# Patient Record
Sex: Female | Born: 1985 | Race: Black or African American | Hispanic: No | Marital: Married | Smoking: Never smoker
Health system: Southern US, Community
[De-identification: ages and names within clinical notes are randomized; demographics above are authoritative.]

## PROBLEM LIST (undated history)

## (undated) DIAGNOSIS — E119 Type 2 diabetes mellitus without complications: Secondary | ICD-10-CM

---

## 2011-06-10 ENCOUNTER — Emergency Department (HOSPITAL_COMMUNITY)
Admission: EM | Admit: 2011-06-10 | Discharge: 2011-06-11 | Disposition: A | Discharge status: Home / Self Care | Payer: Worker's Compensation | Attending: Emergency Medicine | Admitting: Emergency Medicine

## 2011-06-10 DIAGNOSIS — S060X0A Concussion without loss of consciousness, initial encounter: Secondary | ICD-10-CM | POA: Insufficient documentation

## 2011-06-10 DIAGNOSIS — X58XXXA Exposure to other specified factors, initial encounter: Secondary | ICD-10-CM | POA: Insufficient documentation

## 2016-08-31 DIAGNOSIS — H6692 Otitis media, unspecified, left ear: Secondary | ICD-10-CM | POA: Diagnosis not present

## 2016-09-29 DIAGNOSIS — M9903 Segmental and somatic dysfunction of lumbar region: Secondary | ICD-10-CM | POA: Diagnosis not present

## 2016-09-29 DIAGNOSIS — M9901 Segmental and somatic dysfunction of cervical region: Secondary | ICD-10-CM | POA: Diagnosis not present

## 2016-09-29 DIAGNOSIS — M4126 Other idiopathic scoliosis, lumbar region: Secondary | ICD-10-CM | POA: Diagnosis not present

## 2016-09-29 DIAGNOSIS — M9902 Segmental and somatic dysfunction of thoracic region: Secondary | ICD-10-CM | POA: Diagnosis not present

## 2016-09-30 DIAGNOSIS — M4126 Other idiopathic scoliosis, lumbar region: Secondary | ICD-10-CM | POA: Diagnosis not present

## 2016-09-30 DIAGNOSIS — M9901 Segmental and somatic dysfunction of cervical region: Secondary | ICD-10-CM | POA: Diagnosis not present

## 2016-09-30 DIAGNOSIS — M9903 Segmental and somatic dysfunction of lumbar region: Secondary | ICD-10-CM | POA: Diagnosis not present

## 2016-09-30 DIAGNOSIS — M9902 Segmental and somatic dysfunction of thoracic region: Secondary | ICD-10-CM | POA: Diagnosis not present

## 2016-10-03 DIAGNOSIS — M4126 Other idiopathic scoliosis, lumbar region: Secondary | ICD-10-CM | POA: Diagnosis not present

## 2016-10-03 DIAGNOSIS — M9902 Segmental and somatic dysfunction of thoracic region: Secondary | ICD-10-CM | POA: Diagnosis not present

## 2016-10-03 DIAGNOSIS — M9901 Segmental and somatic dysfunction of cervical region: Secondary | ICD-10-CM | POA: Diagnosis not present

## 2016-10-03 DIAGNOSIS — M9903 Segmental and somatic dysfunction of lumbar region: Secondary | ICD-10-CM | POA: Diagnosis not present

## 2016-10-07 DIAGNOSIS — M9901 Segmental and somatic dysfunction of cervical region: Secondary | ICD-10-CM | POA: Diagnosis not present

## 2016-10-07 DIAGNOSIS — M9902 Segmental and somatic dysfunction of thoracic region: Secondary | ICD-10-CM | POA: Diagnosis not present

## 2016-10-07 DIAGNOSIS — M4126 Other idiopathic scoliosis, lumbar region: Secondary | ICD-10-CM | POA: Diagnosis not present

## 2016-10-07 DIAGNOSIS — M9903 Segmental and somatic dysfunction of lumbar region: Secondary | ICD-10-CM | POA: Diagnosis not present

## 2016-10-10 DIAGNOSIS — M9901 Segmental and somatic dysfunction of cervical region: Secondary | ICD-10-CM | POA: Diagnosis not present

## 2016-10-10 DIAGNOSIS — M9902 Segmental and somatic dysfunction of thoracic region: Secondary | ICD-10-CM | POA: Diagnosis not present

## 2016-10-10 DIAGNOSIS — M4126 Other idiopathic scoliosis, lumbar region: Secondary | ICD-10-CM | POA: Diagnosis not present

## 2016-10-10 DIAGNOSIS — M9903 Segmental and somatic dysfunction of lumbar region: Secondary | ICD-10-CM | POA: Diagnosis not present

## 2016-10-14 DIAGNOSIS — M9903 Segmental and somatic dysfunction of lumbar region: Secondary | ICD-10-CM | POA: Diagnosis not present

## 2016-10-14 DIAGNOSIS — M9902 Segmental and somatic dysfunction of thoracic region: Secondary | ICD-10-CM | POA: Diagnosis not present

## 2016-10-14 DIAGNOSIS — M4126 Other idiopathic scoliosis, lumbar region: Secondary | ICD-10-CM | POA: Diagnosis not present

## 2016-10-14 DIAGNOSIS — M9901 Segmental and somatic dysfunction of cervical region: Secondary | ICD-10-CM | POA: Diagnosis not present

## 2016-10-17 DIAGNOSIS — M9901 Segmental and somatic dysfunction of cervical region: Secondary | ICD-10-CM | POA: Diagnosis not present

## 2016-10-17 DIAGNOSIS — M4126 Other idiopathic scoliosis, lumbar region: Secondary | ICD-10-CM | POA: Diagnosis not present

## 2016-10-17 DIAGNOSIS — M9902 Segmental and somatic dysfunction of thoracic region: Secondary | ICD-10-CM | POA: Diagnosis not present

## 2016-10-17 DIAGNOSIS — M9903 Segmental and somatic dysfunction of lumbar region: Secondary | ICD-10-CM | POA: Diagnosis not present

## 2016-10-24 DIAGNOSIS — M9902 Segmental and somatic dysfunction of thoracic region: Secondary | ICD-10-CM | POA: Diagnosis not present

## 2016-10-24 DIAGNOSIS — M9903 Segmental and somatic dysfunction of lumbar region: Secondary | ICD-10-CM | POA: Diagnosis not present

## 2016-10-24 DIAGNOSIS — M9901 Segmental and somatic dysfunction of cervical region: Secondary | ICD-10-CM | POA: Diagnosis not present

## 2016-10-24 DIAGNOSIS — M4126 Other idiopathic scoliosis, lumbar region: Secondary | ICD-10-CM | POA: Diagnosis not present

## 2016-10-31 DIAGNOSIS — M4126 Other idiopathic scoliosis, lumbar region: Secondary | ICD-10-CM | POA: Diagnosis not present

## 2016-10-31 DIAGNOSIS — M9901 Segmental and somatic dysfunction of cervical region: Secondary | ICD-10-CM | POA: Diagnosis not present

## 2016-10-31 DIAGNOSIS — M9902 Segmental and somatic dysfunction of thoracic region: Secondary | ICD-10-CM | POA: Diagnosis not present

## 2016-10-31 DIAGNOSIS — M9903 Segmental and somatic dysfunction of lumbar region: Secondary | ICD-10-CM | POA: Diagnosis not present

## 2016-11-07 DIAGNOSIS — M9901 Segmental and somatic dysfunction of cervical region: Secondary | ICD-10-CM | POA: Diagnosis not present

## 2016-11-07 DIAGNOSIS — M9903 Segmental and somatic dysfunction of lumbar region: Secondary | ICD-10-CM | POA: Diagnosis not present

## 2016-11-07 DIAGNOSIS — M4126 Other idiopathic scoliosis, lumbar region: Secondary | ICD-10-CM | POA: Diagnosis not present

## 2016-11-07 DIAGNOSIS — M9902 Segmental and somatic dysfunction of thoracic region: Secondary | ICD-10-CM | POA: Diagnosis not present

## 2016-11-14 DIAGNOSIS — M9901 Segmental and somatic dysfunction of cervical region: Secondary | ICD-10-CM | POA: Diagnosis not present

## 2016-11-14 DIAGNOSIS — M4126 Other idiopathic scoliosis, lumbar region: Secondary | ICD-10-CM | POA: Diagnosis not present

## 2016-11-14 DIAGNOSIS — M9902 Segmental and somatic dysfunction of thoracic region: Secondary | ICD-10-CM | POA: Diagnosis not present

## 2016-11-14 DIAGNOSIS — M9903 Segmental and somatic dysfunction of lumbar region: Secondary | ICD-10-CM | POA: Diagnosis not present

## 2016-12-05 DIAGNOSIS — Z113 Encounter for screening for infections with a predominantly sexual mode of transmission: Secondary | ICD-10-CM | POA: Diagnosis not present

## 2016-12-05 DIAGNOSIS — L0292 Furuncle, unspecified: Secondary | ICD-10-CM | POA: Diagnosis not present

## 2016-12-05 DIAGNOSIS — Z0000 Encounter for general adult medical examination without abnormal findings: Secondary | ICD-10-CM | POA: Diagnosis not present

## 2016-12-05 DIAGNOSIS — E669 Obesity, unspecified: Secondary | ICD-10-CM | POA: Diagnosis not present

## 2016-12-05 DIAGNOSIS — Z01419 Encounter for gynecological examination (general) (routine) without abnormal findings: Secondary | ICD-10-CM | POA: Diagnosis not present

## 2016-12-05 DIAGNOSIS — Z01411 Encounter for gynecological examination (general) (routine) with abnormal findings: Secondary | ICD-10-CM | POA: Diagnosis not present

## 2016-12-12 DIAGNOSIS — M4126 Other idiopathic scoliosis, lumbar region: Secondary | ICD-10-CM | POA: Diagnosis not present

## 2016-12-12 DIAGNOSIS — M9902 Segmental and somatic dysfunction of thoracic region: Secondary | ICD-10-CM | POA: Diagnosis not present

## 2016-12-12 DIAGNOSIS — M9903 Segmental and somatic dysfunction of lumbar region: Secondary | ICD-10-CM | POA: Diagnosis not present

## 2016-12-12 DIAGNOSIS — M9901 Segmental and somatic dysfunction of cervical region: Secondary | ICD-10-CM | POA: Diagnosis not present

## 2017-01-09 DIAGNOSIS — M9901 Segmental and somatic dysfunction of cervical region: Secondary | ICD-10-CM | POA: Diagnosis not present

## 2017-01-09 DIAGNOSIS — M9903 Segmental and somatic dysfunction of lumbar region: Secondary | ICD-10-CM | POA: Diagnosis not present

## 2017-01-09 DIAGNOSIS — M4126 Other idiopathic scoliosis, lumbar region: Secondary | ICD-10-CM | POA: Diagnosis not present

## 2017-01-09 DIAGNOSIS — M9902 Segmental and somatic dysfunction of thoracic region: Secondary | ICD-10-CM | POA: Diagnosis not present

## 2017-03-06 DIAGNOSIS — M9901 Segmental and somatic dysfunction of cervical region: Secondary | ICD-10-CM | POA: Diagnosis not present

## 2017-03-06 DIAGNOSIS — M9902 Segmental and somatic dysfunction of thoracic region: Secondary | ICD-10-CM | POA: Diagnosis not present

## 2017-03-06 DIAGNOSIS — M9903 Segmental and somatic dysfunction of lumbar region: Secondary | ICD-10-CM | POA: Diagnosis not present

## 2017-03-06 DIAGNOSIS — M4126 Other idiopathic scoliosis, lumbar region: Secondary | ICD-10-CM | POA: Diagnosis not present

## 2017-05-01 DIAGNOSIS — M9902 Segmental and somatic dysfunction of thoracic region: Secondary | ICD-10-CM | POA: Diagnosis not present

## 2017-05-01 DIAGNOSIS — M4126 Other idiopathic scoliosis, lumbar region: Secondary | ICD-10-CM | POA: Diagnosis not present

## 2017-05-01 DIAGNOSIS — M9901 Segmental and somatic dysfunction of cervical region: Secondary | ICD-10-CM | POA: Diagnosis not present

## 2017-05-01 DIAGNOSIS — M9903 Segmental and somatic dysfunction of lumbar region: Secondary | ICD-10-CM | POA: Diagnosis not present

## 2017-06-26 DIAGNOSIS — M9902 Segmental and somatic dysfunction of thoracic region: Secondary | ICD-10-CM | POA: Diagnosis not present

## 2017-06-26 DIAGNOSIS — M4126 Other idiopathic scoliosis, lumbar region: Secondary | ICD-10-CM | POA: Diagnosis not present

## 2017-06-26 DIAGNOSIS — M9903 Segmental and somatic dysfunction of lumbar region: Secondary | ICD-10-CM | POA: Diagnosis not present

## 2017-06-26 DIAGNOSIS — M9901 Segmental and somatic dysfunction of cervical region: Secondary | ICD-10-CM | POA: Diagnosis not present

## 2017-08-23 DIAGNOSIS — M4126 Other idiopathic scoliosis, lumbar region: Secondary | ICD-10-CM | POA: Diagnosis not present

## 2017-08-23 DIAGNOSIS — M9903 Segmental and somatic dysfunction of lumbar region: Secondary | ICD-10-CM | POA: Diagnosis not present

## 2017-08-23 DIAGNOSIS — M9901 Segmental and somatic dysfunction of cervical region: Secondary | ICD-10-CM | POA: Diagnosis not present

## 2017-08-23 DIAGNOSIS — M9902 Segmental and somatic dysfunction of thoracic region: Secondary | ICD-10-CM | POA: Diagnosis not present

## 2017-10-16 DIAGNOSIS — M9903 Segmental and somatic dysfunction of lumbar region: Secondary | ICD-10-CM | POA: Diagnosis not present

## 2017-10-16 DIAGNOSIS — M9902 Segmental and somatic dysfunction of thoracic region: Secondary | ICD-10-CM | POA: Diagnosis not present

## 2017-10-16 DIAGNOSIS — M9901 Segmental and somatic dysfunction of cervical region: Secondary | ICD-10-CM | POA: Diagnosis not present

## 2017-10-16 DIAGNOSIS — M4126 Other idiopathic scoliosis, lumbar region: Secondary | ICD-10-CM | POA: Diagnosis not present

## 2017-12-11 DIAGNOSIS — M9902 Segmental and somatic dysfunction of thoracic region: Secondary | ICD-10-CM | POA: Diagnosis not present

## 2017-12-11 DIAGNOSIS — M9903 Segmental and somatic dysfunction of lumbar region: Secondary | ICD-10-CM | POA: Diagnosis not present

## 2017-12-11 DIAGNOSIS — M9901 Segmental and somatic dysfunction of cervical region: Secondary | ICD-10-CM | POA: Diagnosis not present

## 2017-12-11 DIAGNOSIS — M4126 Other idiopathic scoliosis, lumbar region: Secondary | ICD-10-CM | POA: Diagnosis not present

## 2017-12-26 DIAGNOSIS — J019 Acute sinusitis, unspecified: Secondary | ICD-10-CM | POA: Diagnosis not present

## 2017-12-26 DIAGNOSIS — H6693 Otitis media, unspecified, bilateral: Secondary | ICD-10-CM | POA: Diagnosis not present

## 2018-02-12 DIAGNOSIS — M9903 Segmental and somatic dysfunction of lumbar region: Secondary | ICD-10-CM | POA: Diagnosis not present

## 2018-02-12 DIAGNOSIS — M9902 Segmental and somatic dysfunction of thoracic region: Secondary | ICD-10-CM | POA: Diagnosis not present

## 2018-02-12 DIAGNOSIS — M9901 Segmental and somatic dysfunction of cervical region: Secondary | ICD-10-CM | POA: Diagnosis not present

## 2018-02-12 DIAGNOSIS — M4126 Other idiopathic scoliosis, lumbar region: Secondary | ICD-10-CM | POA: Diagnosis not present

## 2018-03-02 ENCOUNTER — Emergency Department (HOSPITAL_BASED_OUTPATIENT_CLINIC_OR_DEPARTMENT_OTHER)
Admission: EM | Admit: 2018-03-02 | Discharge: 2018-03-02 | Disposition: A | Discharge status: Home / Self Care | Payer: BLUE CROSS/BLUE SHIELD | Attending: Emergency Medicine | Admitting: Emergency Medicine

## 2018-03-02 ENCOUNTER — Encounter (HOSPITAL_BASED_OUTPATIENT_CLINIC_OR_DEPARTMENT_OTHER): Payer: Self-pay | Admitting: Emergency Medicine

## 2018-03-02 ENCOUNTER — Other Ambulatory Visit: Payer: Self-pay

## 2018-03-02 DIAGNOSIS — D72829 Elevated white blood cell count, unspecified: Secondary | ICD-10-CM | POA: Diagnosis not present

## 2018-03-02 DIAGNOSIS — B9689 Other specified bacterial agents as the cause of diseases classified elsewhere: Secondary | ICD-10-CM | POA: Diagnosis not present

## 2018-03-02 DIAGNOSIS — R102 Pelvic and perineal pain: Secondary | ICD-10-CM

## 2018-03-02 DIAGNOSIS — E119 Type 2 diabetes mellitus without complications: Secondary | ICD-10-CM | POA: Insufficient documentation

## 2018-03-02 DIAGNOSIS — N760 Acute vaginitis: Secondary | ICD-10-CM | POA: Insufficient documentation

## 2018-03-02 HISTORY — DX: Type 2 diabetes mellitus without complications: E11.9

## 2018-03-02 LAB — URINALYSIS, MICROSCOPIC (REFLEX)

## 2018-03-02 LAB — BASIC METABOLIC PANEL
ANION GAP: 6 (ref 5–15)
BUN: 11 mg/dL (ref 6–20)
CALCIUM: 9 mg/dL (ref 8.9–10.3)
CO2: 25 mmol/L (ref 22–32)
Chloride: 105 mmol/L (ref 101–111)
Creatinine, Ser: 0.67 mg/dL (ref 0.44–1.00)
GFR calc non Af Amer: >60 mL/min (ref >60)
GLUCOSE: 124 mg/dL — AB (ref 65–99)
POTASSIUM: 4.1 mmol/L (ref 3.5–5.1)
Sodium: 136 mmol/L (ref 135–145)

## 2018-03-02 LAB — CBC
HEMATOCRIT: 36.9 % (ref 36.0–46.0)
Hemoglobin: 12.7 g/dL (ref 12.0–15.0)
MCH: 30.2 pg (ref 26.0–34.0)
MCHC: 34.4 g/dL (ref 30.0–36.0)
MCV: 87.9 fL (ref 78.0–100.0)
Platelets: 296 10*3/uL (ref 150–400)
RBC: 4.2 MIL/uL (ref 3.87–5.11)
RDW: 12.6 % (ref 11.5–15.5)
WBC: 12 10*3/uL — AB (ref 4.0–10.5)

## 2018-03-02 LAB — PREGNANCY, URINE: PREG TEST UR: NEGATIVE

## 2018-03-02 LAB — URINALYSIS, ROUTINE W REFLEX MICROSCOPIC
BILIRUBIN URINE: NEGATIVE
Glucose, UA: NEGATIVE mg/dL
Ketones, ur: NEGATIVE mg/dL
LEUKOCYTES UA: NEGATIVE
NITRITE: NEGATIVE
PH: 6 (ref 5.0–8.0)
Protein, ur: NEGATIVE mg/dL

## 2018-03-02 LAB — WET PREP, GENITAL
Sperm: NONE SEEN
TRICH WET PREP: NONE SEEN
Yeast Wet Prep HPF POC: NONE SEEN

## 2018-03-02 MED ORDER — METRONIDAZOLE 500 MG PO TABS: 500.0000 mg | ORAL_TABLET | Freq: Two times a day (BID) | ORAL | 0 refills | Status: AC

## 2018-03-02 NOTE — Discharge Instructions (Addendum)
Take Flagyl as prescribed.  Take the entire course, even if your symptoms improve.  Do not drink alcohol while taking this medicine, as it can cause liver damage. Continue taking your vitamin D and other supplementation as prescribed. It is important that you follow-up with OB/GYN for further evaluation and management of your symptoms. Take Tylenol or ibuprofen as needed for pain.  You may try heating pads. Return to the emergency room if you develop high fevers, vomiting, severe abdominal pain, or any new or concerning symptoms.

## 2018-03-02 NOTE — ED Provider Notes (Signed)
[facility] EMERGENCY DEPARTMENT Provider Note   CSN: [id number] Arrival date & time: 03/02/18  1450     History   Chief Complaint Chief Complaint  Patient presents with  . Pelvic Pain    HPI Kimberly Sanchez is a 32 y.o. female presenting for evaluation of pelvic pain.  Patient states that she started her period last night.  Today, her period became heavier.  She started to have suprapubic pain which became very severe.  She passed a clot, and the pain lessened.  She reports having issues with her kidneys due to low vitamin D levels, for which she is taking supplementation.  She denies fevers, chills, chest pain, shortness breath, nausea, vomiting, upper abdominal pain, urinary symptoms, abnormal bowel movements.  She denies vaginal discharge.  She is sexually active with one female partner, they use condoms every time.  She is not concerned about STDs at this time.  She denies other medical problems, is diagnosed with prediabetes but does not take medication for this.  Patient's OB/GYN is considering a diagnosis of PCOS, patient has very irregular periods.  Last menstrual cycle was in February, patient states this is normal for her.  Currently, patient reports her pain is dull and not as severe as earlier today.  HPI  Past Medical History:  Diagnosis Date  . Diabetes mellitus without complication (HCC)     There are no active problems to display for this patient.   History reviewed. No pertinent surgical history.   OB History   None      Home Medications    Prior to Admission medications   Medication Sig Start Date End Date Taking? Authorizing Provider  Multiple Vitamin (MULTIVITAMIN WITH MINERALS) TABS tablet Take 1 tablet by mouth daily.   Yes [provider]  metroNIDAZOLE (FLAGYL) 500 MG tablet Take 1 tablet (500 mg total) by mouth 2 (two) times daily. 03/02/18   Prynce Jacober, PA-C    Family History No family history on file.  Social  History Social History   Tobacco Use  . Smoking status: Never Smoker  . Smokeless tobacco: Never Used  Substance Use Topics  . Alcohol use: Yes  . Drug use: Not on file     Allergies   Patient has no known allergies.   Review of Systems Review of Systems  Genitourinary: Positive for pelvic pain and vaginal bleeding.  All other systems reviewed and are negative.    Physical Exam Updated Vital Signs BP 114/81 (BP Location: Left Arm)   Pulse 66   Temp 98.3 F (36.8 C) (Oral)   Resp 18   Ht 5\' 8"  (1.727 m)   Wt 108.9 kg (240 lb)   SpO2 100%   BMI 36.49 kg/m   Physical Exam  Constitutional: She is oriented to person, place, and time. She appears well-developed and well-nourished. No distress.  Appears in no distress  HENT:  Head: Normocephalic and atraumatic.  Eyes: Pupils are equal, round, and reactive to light. Conjunctivae and EOM are normal.  Neck: Normal range of motion. Neck supple.  Cardiovascular: Normal rate, regular rhythm and intact distal pulses.  Pulmonary/Chest: Effort normal and breath sounds normal. No respiratory distress. She has no wheezes.  Abdominal: Soft. Bowel sounds are normal. She exhibits no distension and no mass. There is no tenderness. There is no rebound and no guarding.  No tenderness to palpation of the abdomen.  Soft without rigidity, guarding, distention.  Genitourinary: Rectum normal, vagina normal and uterus normal. Pelvic  exam was performed with patient supine. There is no rash, tenderness or lesion on the right labia. There is no rash, tenderness or lesion on the left labia. Cervix exhibits no motion tenderness and no friability. Right adnexum displays no tenderness. Left adnexum displays no tenderness.  Genitourinary Comments: Chaperone present.  Bleeding and clear mucus noted coming from the cervix.  No CMT or adnexal tenderness.  Musculoskeletal: Normal range of motion.  Neurological: She is alert and oriented to person, place, and  time.  Skin: Skin is warm and dry.  Psychiatric: She has a normal mood and affect.  Nursing note and vitals reviewed.    ED Treatments / Results  Labs (all labs ordered are listed, but only abnormal results are displayed) Labs Reviewed  WET PREP, GENITAL - Abnormal; Notable for the following components:      Result Value   Clue Cells Wet Prep HPF POC PRESENT (*)    WBC, Wet Prep HPF POC FEW (*)    All other components within normal limits  URINALYSIS, ROUTINE W REFLEX MICROSCOPIC - Abnormal; Notable for the following components:   APPearance CLOUDY (*)    Specific Gravity, Urine >1.030 (*)    Hgb urine dipstick LARGE (*)    All other components within normal limits  CBC - Abnormal; Notable for the following components:   WBC 12.0 (*)    All other components within normal limits  BASIC METABOLIC PANEL - Abnormal; Notable for the following components:   Glucose, Bld 124 (*)    All other components within normal limits  URINALYSIS, MICROSCOPIC (REFLEX) - Abnormal; Notable for the following components:   Bacteria, UA RARE (*)    All other components within normal limits  PREGNANCY, URINE  RPR  HIV ANTIBODY (ROUTINE TESTING)  GC/CHLAMYDIA PROBE AMP ([place] AT [facility]    EKG None  Radiology No results found.  Procedures Procedures (including critical care time)  Medications Ordered in ED Medications - No data to display   Initial Impression / Assessment and Plan / ED Course  I have reviewed the triage vital signs and the nursing notes.  Pertinent labs & imaging results that were available during my care of the patient were reviewed by me and considered in my medical decision making (see chart for details).     Pt presenting for evaluation of pelvic pain.  Physical exam reassuring, pain has improved.  Pelvic exam without CMT or adnexal tenderness.  Patient reports pain is now dull.  She is afebrile not tachycardic.  Appears nontoxic.  Will obtain basic labs,  wet prep, GC/CL, and urine.  Labs reassuring, creatinine stable.  Mild leukocytosis.  Clue cells present, will treat for BV.  UA without infection, pregnancy negative.  As pelvic exam was reassuring, doubt TOA, PID, ectopic.  I do not believe pelvic ultrasound is necessary at this time.  Discussed importance of follow-up with OB/GYN for further evaluation and management.  Patient states she understands and will follow-up in the next 1 to 2 weeks.  At this time, patient appears safe for discharge.  Return precautions given.  Patient states she understands and agrees to plan.  Final Clinical Impressions(s) / ED Diagnoses   Final diagnoses:  BV (bacterial vaginosis)  Pelvic pain in female    ED Discharge Orders        Ordered    metroNIDAZOLE (FLAGYL) 500 MG tablet  2 times daily     03/02/18 1658       Sheyann Sulton,  Jeanette Caprice, PA-C 03/02/18 1700    Charlynne Pander, MD 03/02/18 2322

## 2018-03-02 NOTE — ED Triage Notes (Signed)
Pelvic pain with spotting since early this morning. Denies vaginal discharge.

## 2018-03-02 NOTE — ED Notes (Signed)
ED Provider at bedside. 

## 2018-03-03 LAB — HIV ANTIBODY (ROUTINE TESTING W REFLEX): HIV SCREEN 4TH GENERATION: NONREACTIVE

## 2018-03-03 LAB — SYPHILIS: RPR W/REFLEX TO RPR TITER AND TREPONEMAL ANTIBODIES, TRADITIONAL SCREENING AND DIAGNOSIS ALGORITHM: RPR Ser Ql: NONREACTIVE

## 2018-03-05 LAB — GC/CHLAMYDIA PROBE AMP (~~LOC~~) NOT AT ARMC
Chlamydia: NEGATIVE
NEISSERIA GONORRHEA: NEGATIVE

## 2018-04-09 DIAGNOSIS — M4126 Other idiopathic scoliosis, lumbar region: Secondary | ICD-10-CM | POA: Diagnosis not present

## 2018-04-09 DIAGNOSIS — M9903 Segmental and somatic dysfunction of lumbar region: Secondary | ICD-10-CM | POA: Diagnosis not present

## 2018-04-09 DIAGNOSIS — M9901 Segmental and somatic dysfunction of cervical region: Secondary | ICD-10-CM | POA: Diagnosis not present

## 2018-04-09 DIAGNOSIS — M9902 Segmental and somatic dysfunction of thoracic region: Secondary | ICD-10-CM | POA: Diagnosis not present

## 2018-05-23 DIAGNOSIS — M4126 Other idiopathic scoliosis, lumbar region: Secondary | ICD-10-CM | POA: Diagnosis not present

## 2018-05-23 DIAGNOSIS — M9901 Segmental and somatic dysfunction of cervical region: Secondary | ICD-10-CM | POA: Diagnosis not present

## 2018-05-23 DIAGNOSIS — M9902 Segmental and somatic dysfunction of thoracic region: Secondary | ICD-10-CM | POA: Diagnosis not present

## 2018-05-23 DIAGNOSIS — M9903 Segmental and somatic dysfunction of lumbar region: Secondary | ICD-10-CM | POA: Diagnosis not present

## 2018-06-11 DIAGNOSIS — M4126 Other idiopathic scoliosis, lumbar region: Secondary | ICD-10-CM | POA: Diagnosis not present

## 2018-06-11 DIAGNOSIS — M9902 Segmental and somatic dysfunction of thoracic region: Secondary | ICD-10-CM | POA: Diagnosis not present

## 2018-06-11 DIAGNOSIS — M9903 Segmental and somatic dysfunction of lumbar region: Secondary | ICD-10-CM | POA: Diagnosis not present

## 2018-06-11 DIAGNOSIS — M9901 Segmental and somatic dysfunction of cervical region: Secondary | ICD-10-CM | POA: Diagnosis not present

## 2018-07-13 DIAGNOSIS — R21 Rash and other nonspecific skin eruption: Secondary | ICD-10-CM | POA: Diagnosis not present

## 2018-07-30 DIAGNOSIS — M9903 Segmental and somatic dysfunction of lumbar region: Secondary | ICD-10-CM | POA: Diagnosis not present

## 2018-07-30 DIAGNOSIS — M9902 Segmental and somatic dysfunction of thoracic region: Secondary | ICD-10-CM | POA: Diagnosis not present

## 2018-07-30 DIAGNOSIS — M4126 Other idiopathic scoliosis, lumbar region: Secondary | ICD-10-CM | POA: Diagnosis not present

## 2018-07-30 DIAGNOSIS — M9901 Segmental and somatic dysfunction of cervical region: Secondary | ICD-10-CM | POA: Diagnosis not present

## 2018-09-11 DIAGNOSIS — M9901 Segmental and somatic dysfunction of cervical region: Secondary | ICD-10-CM | POA: Diagnosis not present

## 2018-09-11 DIAGNOSIS — M9903 Segmental and somatic dysfunction of lumbar region: Secondary | ICD-10-CM | POA: Diagnosis not present

## 2018-09-11 DIAGNOSIS — M4126 Other idiopathic scoliosis, lumbar region: Secondary | ICD-10-CM | POA: Diagnosis not present

## 2018-09-11 DIAGNOSIS — M9902 Segmental and somatic dysfunction of thoracic region: Secondary | ICD-10-CM | POA: Diagnosis not present

## 2020-09-19 ENCOUNTER — Other Ambulatory Visit: Payer: BLUE CROSS/BLUE SHIELD

## 2020-09-19 ENCOUNTER — Other Ambulatory Visit: Payer: Self-pay

## 2020-09-19 DIAGNOSIS — Z20822 Contact with and (suspected) exposure to covid-19: Secondary | ICD-10-CM

## 2020-09-22 LAB — NOVEL CORONAVIRUS, NAA: SARS-CoV-2, NAA: NOT DETECTED

## 2020-09-22 LAB — SARS-COV-2, NAA 2 DAY TAT

## 2022-02-09 ENCOUNTER — Other Ambulatory Visit: Payer: Self-pay | Admitting: Physician Assistant

## 2022-02-09 DIAGNOSIS — N921 Excessive and frequent menstruation with irregular cycle: Secondary | ICD-10-CM

## 2022-02-10 ENCOUNTER — Other Ambulatory Visit: Payer: Managed Care, Other (non HMO)

## 2022-02-11 ENCOUNTER — Ambulatory Visit
Admission: RE | Admit: 2022-02-11 | Discharge: 2022-02-11 | Disposition: A | Discharge status: Home / Self Care | Payer: Managed Care, Other (non HMO) | Source: Ambulatory Visit | Attending: Physician Assistant | Admitting: Physician Assistant

## 2022-02-11 DIAGNOSIS — N921 Excessive and frequent menstruation with irregular cycle: Secondary | ICD-10-CM

## 2022-06-24 IMAGING — US US PELVIS COMPLETE WITH TRANSVAGINAL
1 series · 13 of 25 positions shown · non-contrast
Comparison: None Available.

CLINICAL DATA: Initial evaluation for menorrhagia with irregular
cycle



[Series 1: us pelvis complete with transvaginal · 0.36mm/px · 13 of 44 slices shown]
[im 1/44]
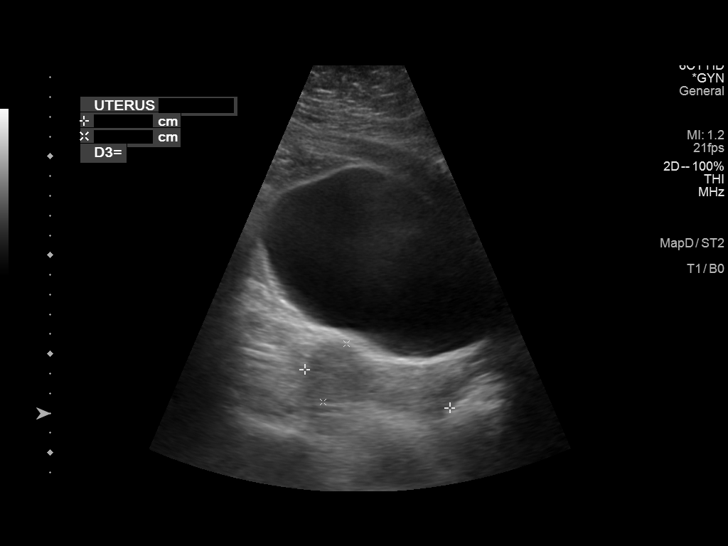
[im 4/44]
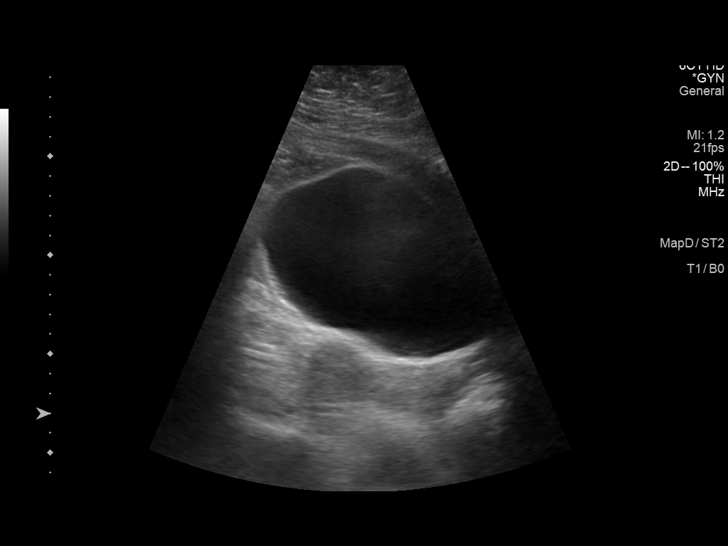
[im 8/44]
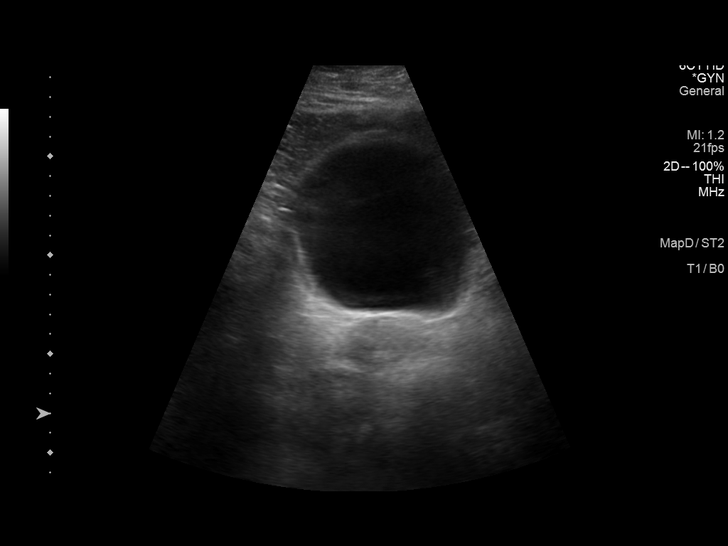
[im 11/44]
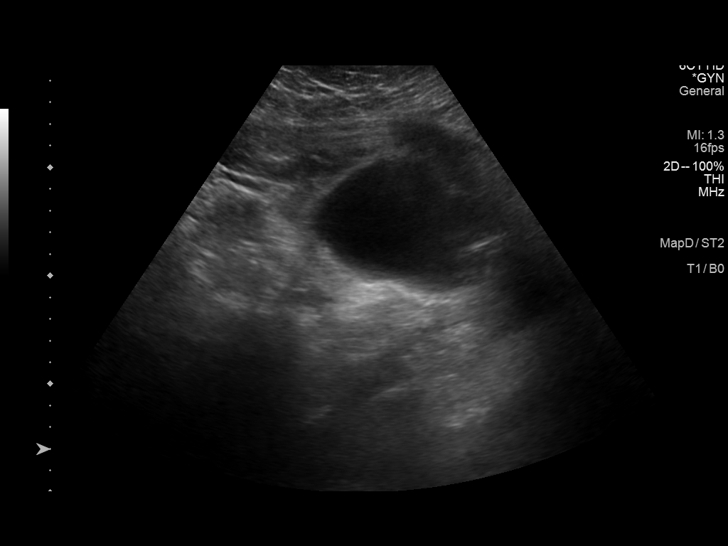
[im 15/44]
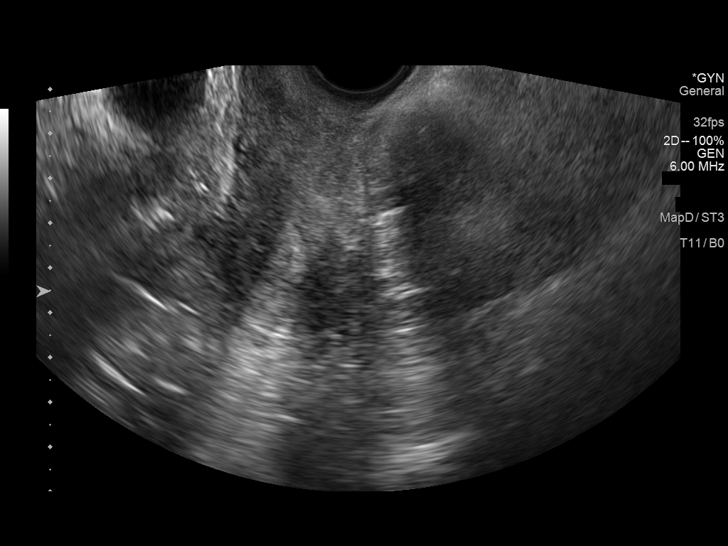
[im 18/44]
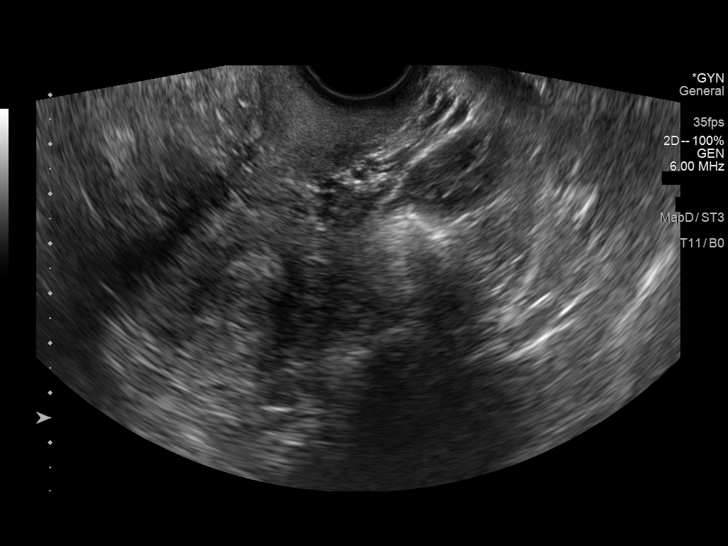
[im 22/44]
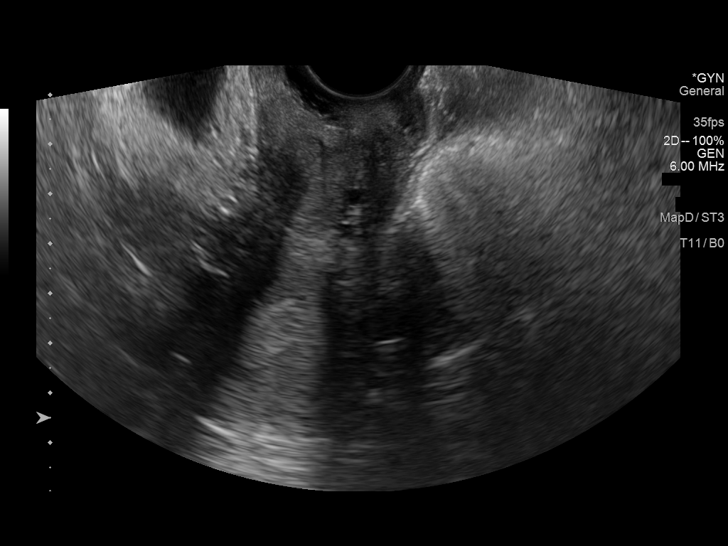
[im 26/44]
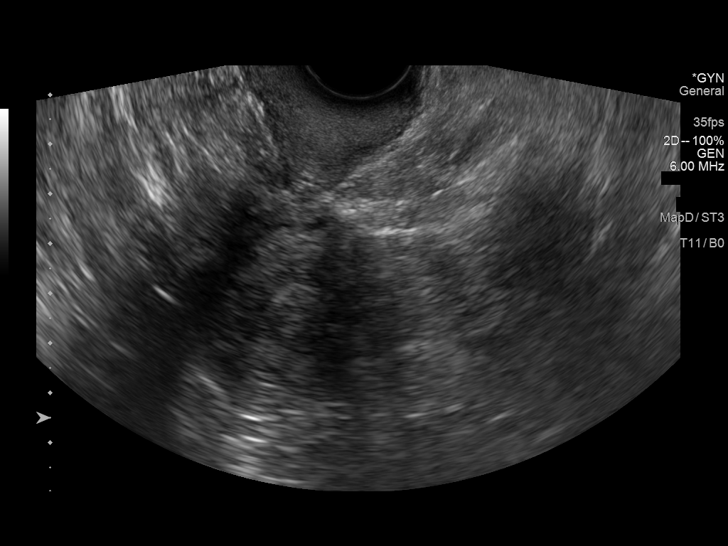
[im 29/44]
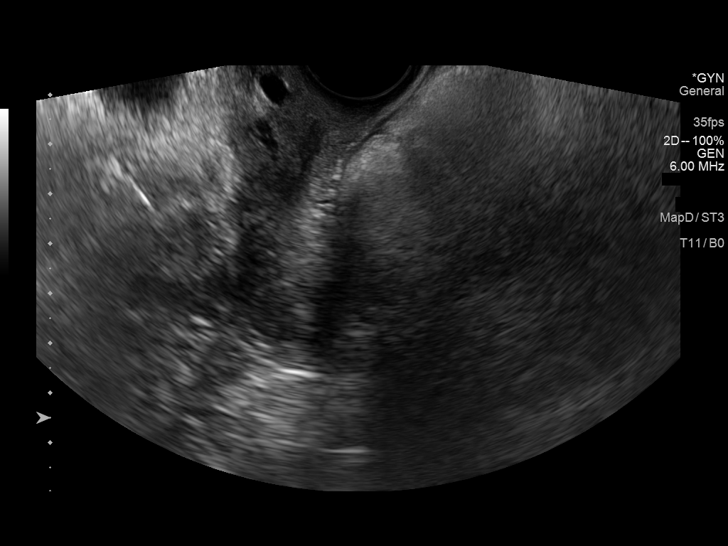
[im 33/44]
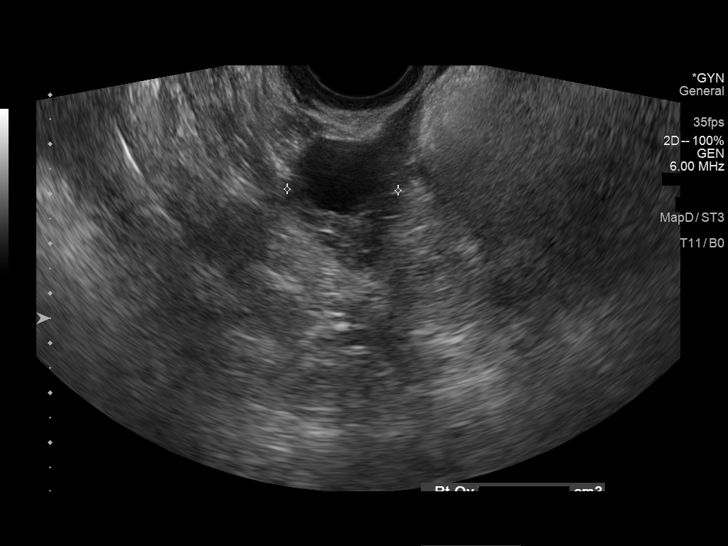
[im 36/44]
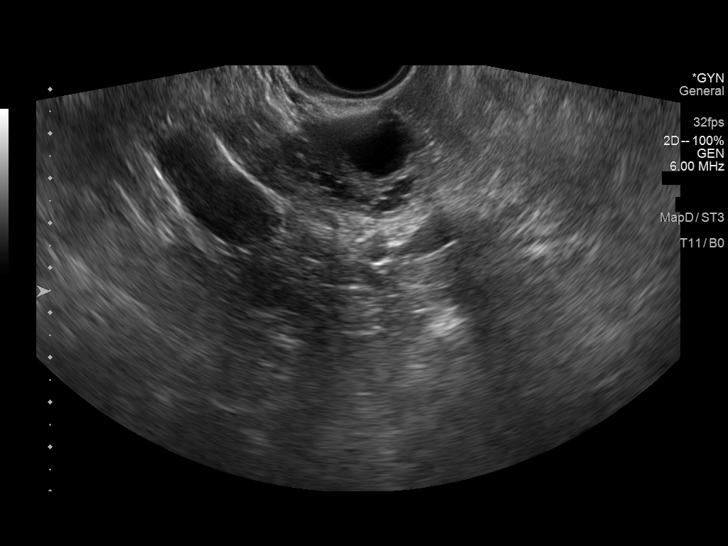
[im 40/44]
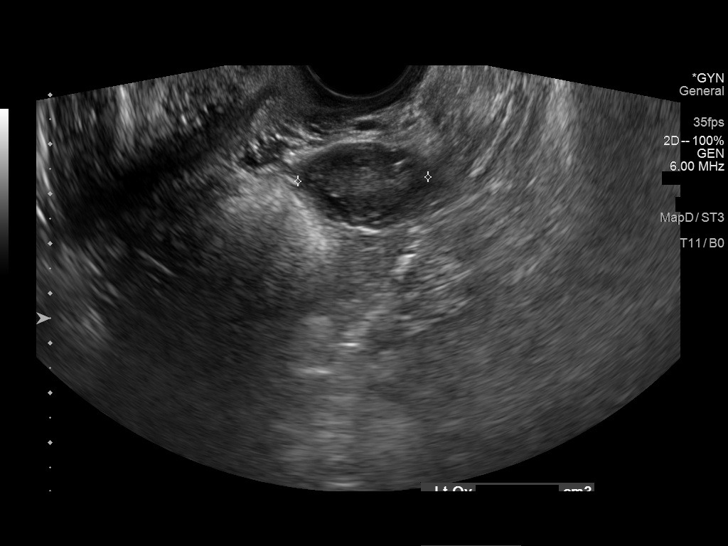
[im 44/44]
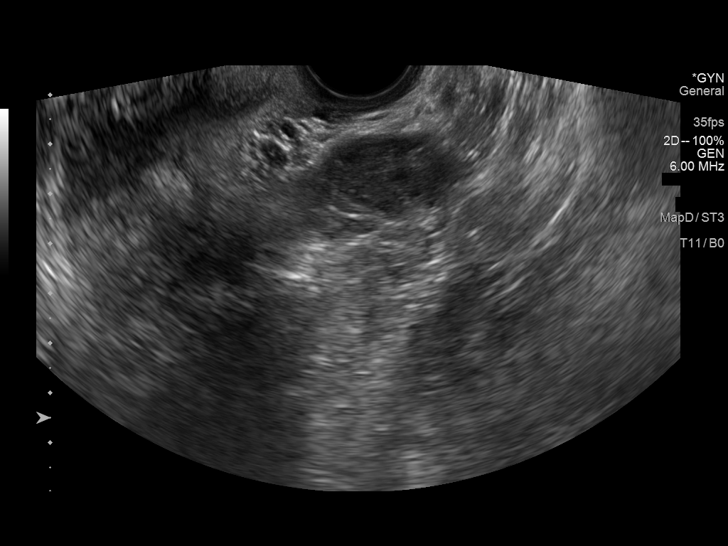

[13 of 25 positions shown; findings below may reference images not displayed]

FINDINGS: Uterus

Measurements: 7.6 x 3.2 x 4.4 cm = volume: 56.0 mL. No fibroids or
other mass visualized.

Endometrium

Thickness: 5.4 mm.  No focal abnormality visualized.

Right ovary

Measurements: 4.1 x 2.7 x 2.2 cm = volume: 12.6 mL. Normal
appearance/no adnexal mass.

Left ovary

Measurements: 2.8 x 1.8 x 2.6 cm = volume: 6.8 mL. Normal
appearance/no adnexal mass.

Other findings

No abnormal free fluid.
IMPRESSION: 1. Normal pelvic ultrasound.
2. Endometrial stripe within normal limits measuring 5.4 mm in
thickness. If bleeding remains unresponsive to hormonal or medical
therapy, sonohysterogram should be considered for focal lesion
work-up. (Ref: Radiological Reasoning: Algorithmic Workup of
Abnormal Vaginal Bleeding with Endovaginal Sonography and
Sonohysterography. AJR 7338; 191:S68-73).
